# Patient Record
Sex: Female | Born: 1940 | Race: White | Hispanic: No | Marital: Single
Health system: Southern US, Community
[De-identification: ages and names within clinical notes are randomized; demographics above are authoritative.]

---

## 2009-02-28 ENCOUNTER — Ambulatory Visit: Payer: Self-pay | Admitting: Oncology

## 2009-03-14 ENCOUNTER — Ambulatory Visit: Payer: Self-pay | Admitting: Family Medicine

## 2009-03-27 ENCOUNTER — Ambulatory Visit: Payer: Self-pay | Admitting: Family Medicine

## 2009-03-27 ENCOUNTER — Ambulatory Visit: Payer: Self-pay | Admitting: Oncology

## 2009-03-31 ENCOUNTER — Ambulatory Visit: Payer: Self-pay | Admitting: Oncology

## 2009-04-01 ENCOUNTER — Ambulatory Visit: Payer: Self-pay | Admitting: Gastroenterology

## 2009-04-03 ENCOUNTER — Ambulatory Visit: Payer: Self-pay | Admitting: Oncology

## 2009-04-18 ENCOUNTER — Ambulatory Visit: Payer: Self-pay | Admitting: Surgery

## 2009-04-21 ENCOUNTER — Ambulatory Visit: Payer: Self-pay | Admitting: Oncology

## 2009-04-30 ENCOUNTER — Ambulatory Visit: Payer: Self-pay | Admitting: Oncology

## 2009-05-31 ENCOUNTER — Ambulatory Visit: Payer: Self-pay | Admitting: Oncology

## 2009-07-01 ENCOUNTER — Ambulatory Visit: Payer: Self-pay | Admitting: Oncology

## 2009-07-29 ENCOUNTER — Ambulatory Visit: Payer: Self-pay | Admitting: Oncology

## 2009-08-29 ENCOUNTER — Ambulatory Visit: Payer: Self-pay | Admitting: Oncology

## 2009-09-28 ENCOUNTER — Ambulatory Visit: Payer: Self-pay | Admitting: Oncology

## 2009-10-09 ENCOUNTER — Inpatient Hospital Stay: Payer: Self-pay | Admitting: Oncology

## 2009-10-29 ENCOUNTER — Ambulatory Visit: Payer: Self-pay | Admitting: Oncology

## 2009-11-14 LAB — CEA: CEA: 8.4 ng/mL — ABNORMAL HIGH (ref 0.0–4.7)

## 2009-11-28 ENCOUNTER — Ambulatory Visit: Payer: Self-pay | Admitting: Oncology

## 2009-12-29 ENCOUNTER — Ambulatory Visit: Payer: Self-pay | Admitting: Oncology

## 2010-01-29 ENCOUNTER — Ambulatory Visit: Payer: Self-pay | Admitting: Oncology

## 2010-02-28 ENCOUNTER — Ambulatory Visit: Payer: Self-pay | Admitting: Oncology

## 2010-03-31 ENCOUNTER — Ambulatory Visit: Payer: Self-pay | Admitting: Oncology

## 2010-04-01 ENCOUNTER — Ambulatory Visit: Payer: Self-pay | Admitting: Oncology

## 2010-04-30 ENCOUNTER — Ambulatory Visit: Payer: Self-pay | Admitting: Oncology

## 2010-05-31 ENCOUNTER — Ambulatory Visit: Payer: Self-pay | Admitting: Oncology

## 2010-06-08 ENCOUNTER — Inpatient Hospital Stay: Payer: Self-pay | Admitting: Oncology

## 2010-06-10 LAB — CEA: CEA: 3.9 ng/mL (ref 0.0–4.7)

## 2010-07-01 ENCOUNTER — Ambulatory Visit: Payer: Self-pay | Admitting: Oncology

## 2010-07-30 ENCOUNTER — Ambulatory Visit: Payer: Self-pay | Admitting: Oncology

## 2010-08-30 ENCOUNTER — Ambulatory Visit: Payer: Self-pay | Admitting: Oncology

## 2010-09-29 ENCOUNTER — Ambulatory Visit: Payer: Self-pay | Admitting: Oncology

## 2010-10-30 ENCOUNTER — Ambulatory Visit: Payer: Self-pay | Admitting: Oncology

## 2010-11-29 ENCOUNTER — Ambulatory Visit: Payer: Self-pay | Admitting: Oncology

## 2010-12-30 ENCOUNTER — Ambulatory Visit: Payer: Self-pay | Admitting: Oncology

## 2011-01-30 ENCOUNTER — Ambulatory Visit: Payer: Self-pay | Admitting: Oncology

## 2011-03-01 ENCOUNTER — Ambulatory Visit: Payer: Self-pay | Admitting: Oncology

## 2011-04-01 ENCOUNTER — Ambulatory Visit: Payer: Self-pay | Admitting: Oncology

## 2011-05-01 ENCOUNTER — Ambulatory Visit: Payer: Self-pay | Admitting: Oncology

## 2011-06-01 ENCOUNTER — Ambulatory Visit: Payer: Self-pay | Admitting: Oncology

## 2011-06-03 LAB — CBC CANCER CENTER
Basophil #: 0.1 x10 3/mm (ref 0.0–0.1)
Basophil %: 0.5 %
Eosinophil #: 0.3 x10 3/mm (ref 0.0–0.7)
Eosinophil %: 2.3 %
HGB: 10.9 g/dL — ABNORMAL LOW (ref 12.0–16.0)
Lymphocyte #: 1 x10 3/mm (ref 1.0–3.6)
MCH: 29.7 pg (ref 26.0–34.0)
MCHC: 33.3 g/dL (ref 32.0–36.0)
MCV: 89 fL (ref 80–100)
Monocyte %: 9.3 %
Neutrophil #: 10.5 x10 3/mm — ABNORMAL HIGH (ref 1.4–6.5)
Neutrophil %: 80.2 %
Platelet: 198 x10 3/mm (ref 150–440)
RBC: 3.66 10*6/uL — ABNORMAL LOW (ref 3.80–5.20)
WBC: 13 x10 3/mm — ABNORMAL HIGH (ref 3.6–11.0)

## 2011-06-03 LAB — COMPREHENSIVE METABOLIC PANEL
Albumin: 2.7 g/dL — ABNORMAL LOW (ref 3.4–5.0)
Alkaline Phosphatase: 289 U/L — ABNORMAL HIGH (ref 50–136)
Anion Gap: 8 (ref 7–16)
BUN: 13 mg/dL (ref 7–18)
Bilirubin,Total: 0.6 mg/dL (ref 0.2–1.0)
Calcium, Total: 8.6 mg/dL (ref 8.5–10.1)
Chloride: 107 mmol/L (ref 98–107)
Creatinine: 0.89 mg/dL (ref 0.60–1.30)
EGFR (Non-African Amer.): >60
Glucose: 129 mg/dL — ABNORMAL HIGH (ref 65–99)
Osmolality: 281 (ref 275–301)
SGOT(AST): 60 U/L — ABNORMAL HIGH (ref 15–37)
SGPT (ALT): 27 U/L
Total Protein: 7.1 g/dL (ref 6.4–8.2)

## 2011-06-11 IMAGING — CT CT HEAD WITHOUT AND WITH CONTRAST
1 of 2 series · 13 of 30 positions shown, 17 images · non-contrast
Comparison: none

REASON FOR EXAM: confusion
COMMENTS:

[Series 2: without · axial · non-contrast · 0.40mm/px · z∈[+908,+1038]mm · 13 of 32 slices shown, 17 images]
[im 3/32  brain]
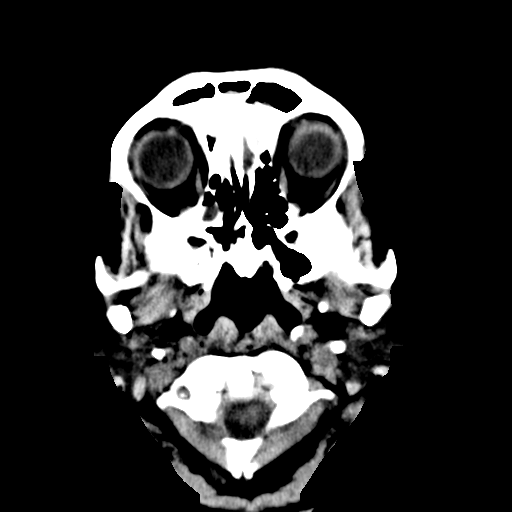
[im 3/32  bone]
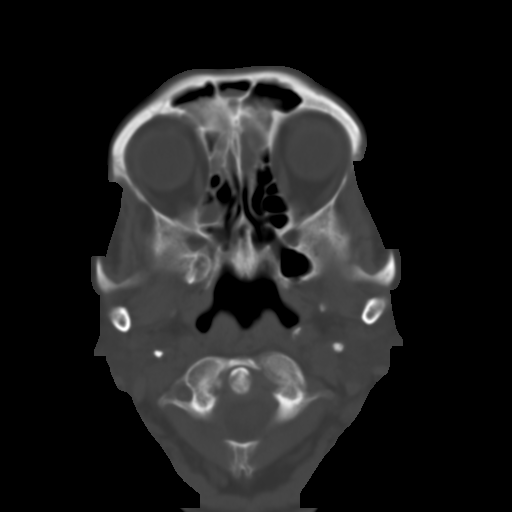
[im 5/32  brain]
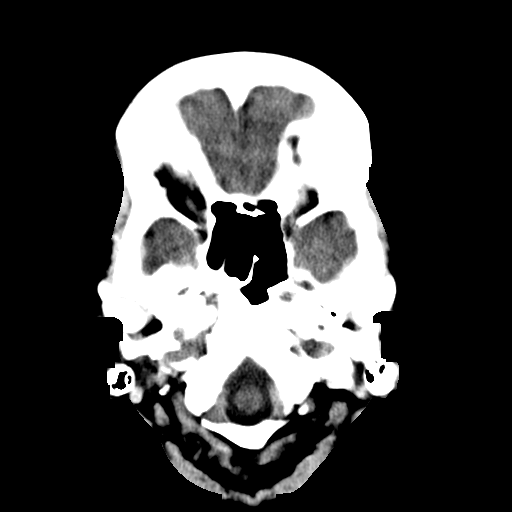
[im 7/32  brain]
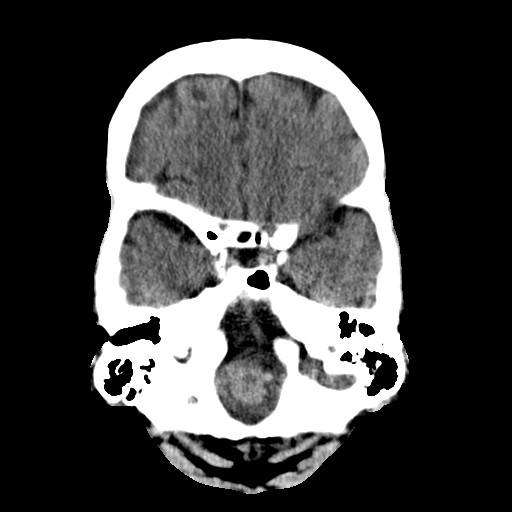
[im 9/32  brain]
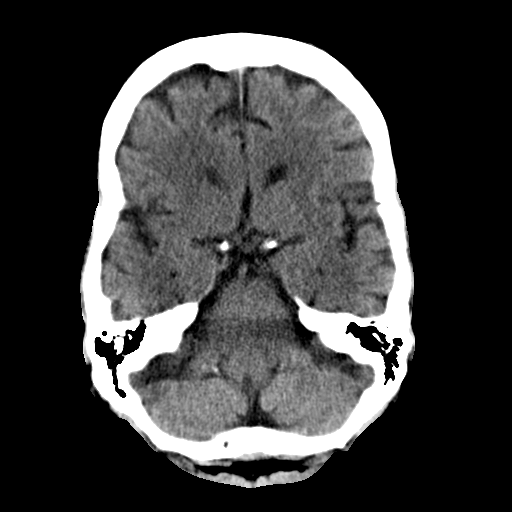
[im 12/32  brain]
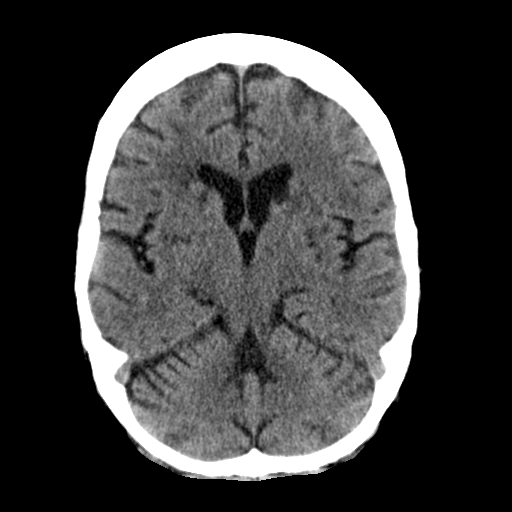
[im 12/32  bone]
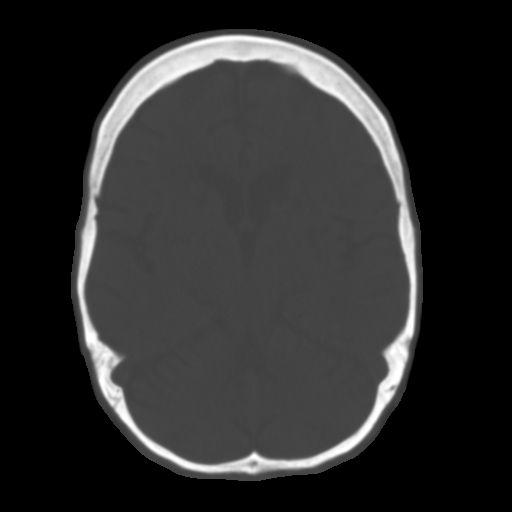
[im 14/32  brain]
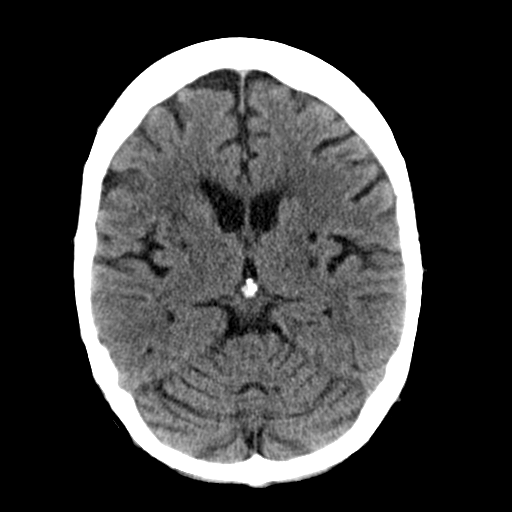
[im 16/32  brain]
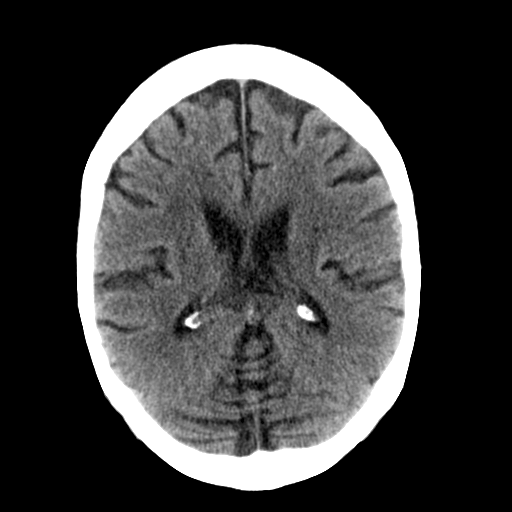
[im 18/32  brain]
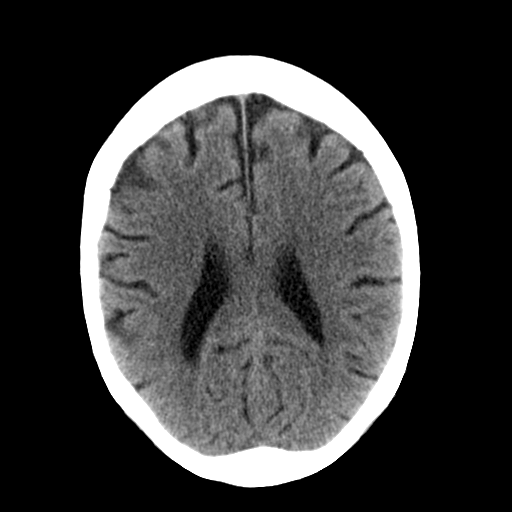
[im 20/32  brain]
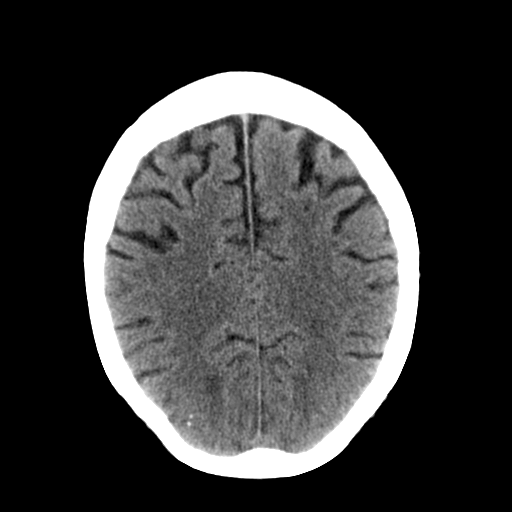
[im 20/32  bone]
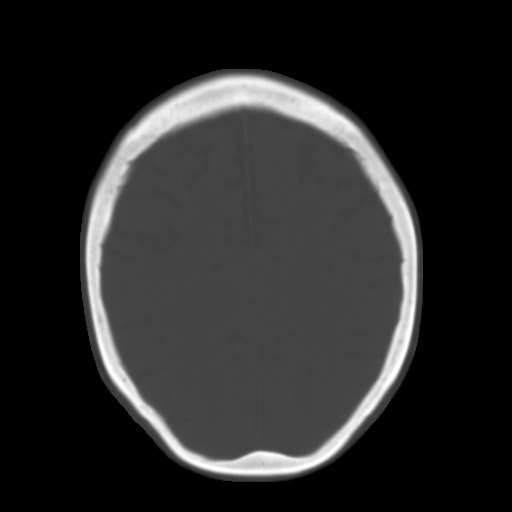
[im 23/32  brain]
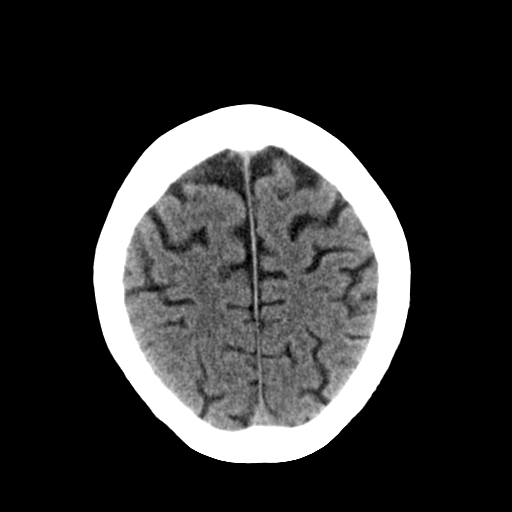
[im 25/32  brain]
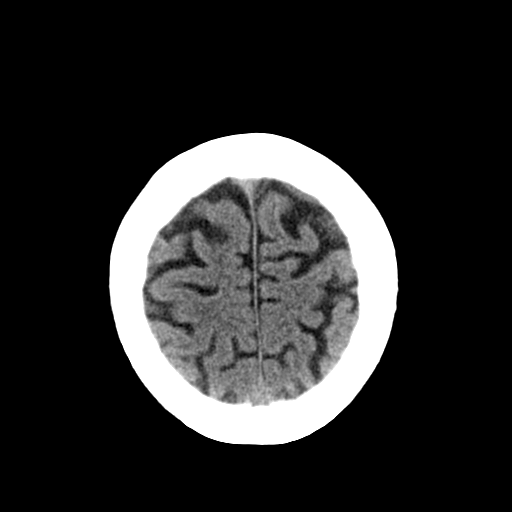
[im 27/32  brain]
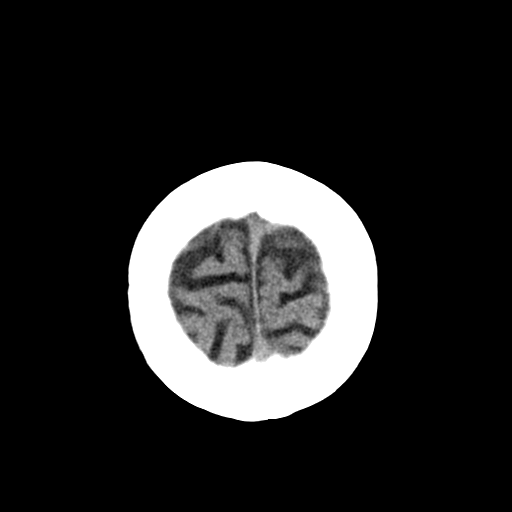
[im 29/32  brain]
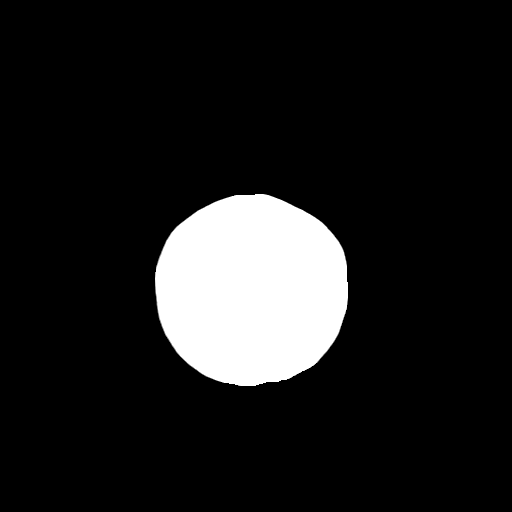
[im 29/32  bone]
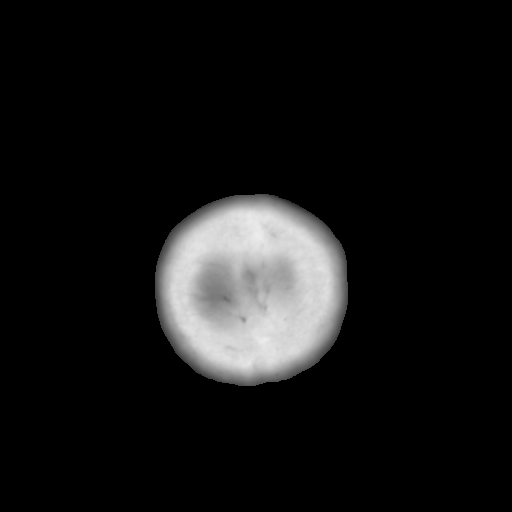

[13 of 30 positions shown; findings below may reference images not displayed]

PROCEDURE:     CT  - CT HEAD W/WO  - December 18, 2009  [DATE]

RESULT:     Axial noncontrast and contrast enhanced CT scans were performed
through the brain at 5 mm intervals and slice thicknesses. Comparison is
made to the study 09 October, 2009. The patient has history of colonic
malignancy and is experiencing mental status change.

The noncontrast images reveal the ventricles to be normal in size and
position. There is mild age-appropriate diffuse cerebral and cerebellar
atrophy. There are hypodensities in the basal ganglia bilaterally consistent
with prior lacunar infarctions. There is no evidence of an intracranial
hemorrhage or evolving ischemic infarction. Following contrast
administration the enhancement pattern of the brain parenchyma is within the
limits of normal. No enhancing masses are identified.

At bone window settings there is mucoperiosteal thickening and likely fluid
in the right maxillary sinus. There is a small amount of fluid in the right
ethmoid sinus. There is fluid and soft tissue density in the frontal
sinuses. A small amount of mucoperiosteal thickening in the sphenoid sinuses
is present. I do not see evidence of an acute skull fracture nor of a lytic
or blastic calvarial lesion.
IMPRESSION: 1. The findings are consistent with acute sinus inflammation involving the
right frontal, right ethmoid, right maxillary, and right sphenoid sinuses.
This may be contributing to the patient's mental status change.
2. I do not see acute intracranial abnormality. There are chronic changes
present.

The findings were relayed to Dr. [REDACTED] through the notify
procedure.(*)

## 2011-06-15 LAB — CBC CANCER CENTER
Basophil #: 0 x10 3/mm (ref 0.0–0.1)
Eosinophil #: 0.2 x10 3/mm (ref 0.0–0.7)
HCT: 20 % — ABNORMAL LOW (ref 35.0–47.0)
Lymphocyte #: 1.2 x10 3/mm (ref 1.0–3.6)
MCH: 30.2 pg (ref 26.0–34.0)
MCHC: 32.4 g/dL (ref 32.0–36.0)
MCV: 93 fL (ref 80–100)
Monocyte #: 1.2 x10 3/mm — ABNORMAL HIGH (ref 0.0–0.7)
Monocyte %: 6.8 %
Neutrophil #: 15.6 x10 3/mm — ABNORMAL HIGH (ref 1.4–6.5)
Platelet: 205 x10 3/mm (ref 150–440)
RBC: 2.15 10*6/uL — ABNORMAL LOW (ref 3.80–5.20)
RDW: 23.5 % — ABNORMAL HIGH (ref 11.5–14.5)
WBC: 18.2 x10 3/mm — ABNORMAL HIGH (ref 3.6–11.0)

## 2011-06-15 LAB — COMPREHENSIVE METABOLIC PANEL
Albumin: 2.6 g/dL — ABNORMAL LOW (ref 3.4–5.0)
Anion Gap: 12 (ref 7–16)
Calcium, Total: 8 mg/dL — ABNORMAL LOW (ref 8.5–10.1)
Co2: 21 mmol/L (ref 21–32)
EGFR (Non-African Amer.): >60
Glucose: 148 mg/dL — ABNORMAL HIGH (ref 65–99)
Osmolality: 288 (ref 275–301)
Potassium: 4.1 mmol/L (ref 3.5–5.1)
SGOT(AST): 35 U/L (ref 15–37)
SGPT (ALT): 21 U/L
Total Protein: 6.3 g/dL — ABNORMAL LOW (ref 6.4–8.2)

## 2011-06-15 LAB — MAGNESIUM: Magnesium: 1.6 mg/dL — ABNORMAL LOW

## 2011-06-22 LAB — CBC CANCER CENTER
Eosinophil #: 0 x10 3/mm (ref 0.0–0.7)
Eosinophil %: 0.2 %
Lymphocyte #: 0.8 x10 3/mm — ABNORMAL LOW (ref 1.0–3.6)
MCH: 30.1 pg (ref 26.0–34.0)
MCHC: 33.3 g/dL (ref 32.0–36.0)
MCV: 90 fL (ref 80–100)
Monocyte #: 0.7 x10 3/mm (ref 0.0–0.7)
Monocyte %: 3.7 %
Neutrophil %: 92.3 %
Platelet: 175 x10 3/mm (ref 150–440)
RBC: 3.26 10*6/uL — ABNORMAL LOW (ref 3.80–5.20)
RDW: 20.1 % — ABNORMAL HIGH (ref 11.5–14.5)
WBC: 19.9 x10 3/mm — ABNORMAL HIGH (ref 3.6–11.0)

## 2011-07-01 LAB — CBC CANCER CENTER
Basophil #: 0 x10 3/mm (ref 0.0–0.1)
Basophil %: 0 %
Eosinophil %: 1.3 %
Lymphocyte #: 0.9 x10 3/mm — ABNORMAL LOW (ref 1.0–3.6)
Lymphocyte %: 12.6 %
MCV: 91 fL (ref 80–100)
Monocyte %: 18.6 %
Neutrophil %: 67.5 %
Platelet: 212 x10 3/mm (ref 150–440)
RBC: 3.6 10*6/uL — ABNORMAL LOW (ref 3.80–5.20)
RDW: 22.5 % — ABNORMAL HIGH (ref 11.5–14.5)
WBC: 7.1 x10 3/mm (ref 3.6–11.0)

## 2011-07-01 LAB — COMPREHENSIVE METABOLIC PANEL
Albumin: 2.5 g/dL — ABNORMAL LOW (ref 3.4–5.0)
Alkaline Phosphatase: 209 U/L — ABNORMAL HIGH (ref 50–136)
Anion Gap: 9 (ref 7–16)
BUN: 19 mg/dL — ABNORMAL HIGH (ref 7–18)
Bilirubin,Total: 0.3 mg/dL (ref 0.2–1.0)
Calcium, Total: 8.2 mg/dL — ABNORMAL LOW (ref 8.5–10.1)
Chloride: 109 mmol/L — ABNORMAL HIGH (ref 98–107)
Co2: 24 mmol/L (ref 21–32)
Creatinine: 0.76 mg/dL (ref 0.60–1.30)
Osmolality: 287 (ref 275–301)
Potassium: 4.3 mmol/L (ref 3.5–5.1)
Sodium: 142 mmol/L (ref 136–145)
Total Protein: 6.4 g/dL (ref 6.4–8.2)

## 2011-07-02 ENCOUNTER — Ambulatory Visit: Payer: Self-pay | Admitting: Oncology

## 2011-07-20 LAB — CBC CANCER CENTER
Basophil %: 0.5 %
Comment - H1-Com3: NORMAL
Eosinophil %: 4 %
Eosinophil: 4 %
HCT: 24.9 % — ABNORMAL LOW (ref 35.0–47.0)
Lymphocyte #: 0.7 x10 3/mm — ABNORMAL LOW (ref 1.0–3.6)
MCH: 30.4 pg (ref 26.0–34.0)
MCHC: 33.3 g/dL (ref 32.0–36.0)
MCV: 91 fL (ref 80–100)
Monocyte #: 1.2 x10 3/mm — ABNORMAL HIGH (ref 0.0–0.7)
Monocyte %: 23.2 %
Neutrophil #: 2.9 x10 3/mm (ref 1.4–6.5)
Platelet: 129 x10 3/mm — ABNORMAL LOW (ref 150–440)
RDW: 22.8 % — ABNORMAL HIGH (ref 11.5–14.5)
Segmented Neutrophils: 60 %
WBC: 5 x10 3/mm (ref 3.6–11.0)

## 2011-07-20 LAB — COMPREHENSIVE METABOLIC PANEL
Anion Gap: 13 (ref 7–16)
BUN: 11 mg/dL (ref 7–18)
Bilirubin,Total: 0.5 mg/dL (ref 0.2–1.0)
Calcium, Total: 8 mg/dL — ABNORMAL LOW (ref 8.5–10.1)
Creatinine: 0.79 mg/dL (ref 0.60–1.30)
EGFR (African American): >60
EGFR (Non-African Amer.): >60
Glucose: 132 mg/dL — ABNORMAL HIGH (ref 65–99)
Osmolality: 279 (ref 275–301)

## 2011-07-20 LAB — MAGNESIUM: Magnesium: 1.6 mg/dL — ABNORMAL LOW

## 2011-07-30 ENCOUNTER — Ambulatory Visit: Payer: Self-pay | Admitting: Oncology

## 2011-08-03 LAB — CBC CANCER CENTER
Basophil #: 0 x10 3/mm (ref 0.0–0.1)
Eosinophil %: 3.7 %
Lymphocyte #: 0.8 x10 3/mm — ABNORMAL LOW (ref 1.0–3.6)
Lymphocyte %: 19.6 %
MCH: 30.2 pg (ref 26.0–34.0)
MCV: 90 fL (ref 80–100)
Monocyte #: 0.9 x10 3/mm — ABNORMAL HIGH (ref 0.0–0.7)
Neutrophil %: 52.5 %
Platelet: 73 x10 3/mm — ABNORMAL LOW (ref 150–440)
RBC: 3.2 10*6/uL — ABNORMAL LOW (ref 3.80–5.20)
RDW: 22 % — ABNORMAL HIGH (ref 11.5–14.5)

## 2011-08-03 LAB — COMPREHENSIVE METABOLIC PANEL
Anion Gap: 10 (ref 7–16)
Calcium, Total: 8 mg/dL — ABNORMAL LOW (ref 8.5–10.1)
Chloride: 108 mmol/L — ABNORMAL HIGH (ref 98–107)
Co2: 23 mmol/L (ref 21–32)
Creatinine: 0.69 mg/dL (ref 0.60–1.30)
EGFR (African American): >60
EGFR (Non-African Amer.): >60
Glucose: 113 mg/dL — ABNORMAL HIGH (ref 65–99)
Potassium: 4.1 mmol/L (ref 3.5–5.1)
SGOT(AST): 50 U/L — ABNORMAL HIGH (ref 15–37)
SGPT (ALT): 28 U/L

## 2011-08-03 LAB — MAGNESIUM: Magnesium: 1.5 mg/dL — ABNORMAL LOW

## 2011-08-17 LAB — CBC CANCER CENTER
Basophil %: 0.9 %
Eosinophil #: 0.3 x10 3/mm (ref 0.0–0.7)
HCT: 16.1 % — ABNORMAL LOW (ref 35.0–47.0)
HGB: 5.2 g/dL — ABNORMAL LOW (ref 12.0–16.0)
MCH: 29.8 pg (ref 26.0–34.0)
MCHC: 32 g/dL (ref 32.0–36.0)
MCV: 93 fL (ref 80–100)
Monocyte #: 1.9 x10 3/mm — ABNORMAL HIGH (ref 0.0–0.7)
Neutrophil #: 8.3 x10 3/mm — ABNORMAL HIGH (ref 1.4–6.5)
Platelet: 260 x10 3/mm (ref 150–440)
RBC: 1.73 10*6/uL — ABNORMAL LOW (ref 3.80–5.20)

## 2011-08-17 LAB — COMPREHENSIVE METABOLIC PANEL
Anion Gap: 16 (ref 7–16)
BUN: 37 mg/dL — ABNORMAL HIGH (ref 7–18)
Calcium, Total: 8 mg/dL — ABNORMAL LOW (ref 8.5–10.1)
Chloride: 110 mmol/L — ABNORMAL HIGH (ref 98–107)
Co2: 17 mmol/L — ABNORMAL LOW (ref 21–32)
EGFR (African American): >60
EGFR (Non-African Amer.): 57 — ABNORMAL LOW
Osmolality: 296 (ref 275–301)
Potassium: 3.8 mmol/L (ref 3.5–5.1)
SGOT(AST): 72 U/L — ABNORMAL HIGH (ref 15–37)
SGPT (ALT): 34 U/L
Total Protein: 6 g/dL — ABNORMAL LOW (ref 6.4–8.2)

## 2011-08-20 LAB — OCCULT BLOOD X 1 CARD TO LAB, STOOL
Occult Blood, Feces: POSITIVE
Occult Blood, Feces: NEGATIVE

## 2011-08-24 LAB — CBC CANCER CENTER
Basophil #: 0.1 x10 3/mm (ref 0.0–0.1)
Basophil %: 1.1 %
Lymphocyte %: 9.3 %
MCH: 29.4 pg (ref 26.0–34.0)
Monocyte #: 1.6 x10 3/mm — ABNORMAL HIGH (ref 0.0–0.7)
Monocyte %: 17.4 %
RDW: 20.3 % — ABNORMAL HIGH (ref 11.5–14.5)
WBC: 9.1 x10 3/mm (ref 3.6–11.0)

## 2011-08-24 LAB — COMPREHENSIVE METABOLIC PANEL
Alkaline Phosphatase: 227 U/L — ABNORMAL HIGH (ref 50–136)
BUN: 14 mg/dL (ref 7–18)
Calcium, Total: 8 mg/dL — ABNORMAL LOW (ref 8.5–10.1)
Co2: 19 mmol/L — ABNORMAL LOW (ref 21–32)
Creatinine: 0.81 mg/dL (ref 0.60–1.30)
EGFR (African American): >60
Potassium: 4.4 mmol/L (ref 3.5–5.1)
SGPT (ALT): 44 U/L
Sodium: 139 mmol/L (ref 136–145)
Total Protein: 6.2 g/dL — ABNORMAL LOW (ref 6.4–8.2)

## 2011-08-24 LAB — MAGNESIUM: Magnesium: 1.7 mg/dL — ABNORMAL LOW

## 2011-08-30 ENCOUNTER — Ambulatory Visit: Payer: Self-pay | Admitting: Oncology

## 2011-09-07 LAB — CBC CANCER CENTER
Basophil #: 0 x10 3/mm (ref 0.0–0.1)
Basophil %: 0 %
Eosinophil %: 3.9 %
HGB: 9.5 g/dL — ABNORMAL LOW (ref 12.0–16.0)
Lymphocyte #: 0.6 x10 3/mm — ABNORMAL LOW (ref 1.0–3.6)
Lymphocyte %: 10.2 %
MCHC: 32.6 g/dL (ref 32.0–36.0)
Monocyte #: 1 x10 3/mm — ABNORMAL HIGH (ref 0.0–0.7)
Neutrophil #: 4.1 x10 3/mm (ref 1.4–6.5)
Platelet: 177 x10 3/mm (ref 150–440)
RBC: 3.29 10*6/uL — ABNORMAL LOW (ref 3.80–5.20)
WBC: 6 x10 3/mm (ref 3.6–11.0)

## 2011-09-07 LAB — BASIC METABOLIC PANEL
BUN: 9 mg/dL (ref 7–18)
Calcium, Total: 8.7 mg/dL (ref 8.5–10.1)
Co2: 22 mmol/L (ref 21–32)
Creatinine: 0.76 mg/dL (ref 0.60–1.30)
EGFR (African American): >60
Osmolality: 282 (ref 275–301)

## 2011-09-07 LAB — MAGNESIUM: Magnesium: 1.6 mg/dL — ABNORMAL LOW

## 2011-09-28 LAB — COMPREHENSIVE METABOLIC PANEL
Albumin: 2.5 g/dL — ABNORMAL LOW (ref 3.4–5.0)
Alkaline Phosphatase: 242 U/L — ABNORMAL HIGH (ref 50–136)
Anion Gap: 13 (ref 7–16)
Bilirubin,Total: 0.5 mg/dL (ref 0.2–1.0)
Co2: 16 mmol/L — ABNORMAL LOW (ref 21–32)
Creatinine: 0.72 mg/dL (ref 0.60–1.30)
EGFR (African American): >60
Glucose: 127 mg/dL — ABNORMAL HIGH (ref 65–99)
Osmolality: 286 (ref 275–301)
Potassium: 4.1 mmol/L (ref 3.5–5.1)
SGOT(AST): 96 U/L — ABNORMAL HIGH (ref 15–37)

## 2011-09-28 LAB — CBC CANCER CENTER
Basophil #: 0.2 x10 3/mm — ABNORMAL HIGH (ref 0.0–0.1)
Basophil %: 2.3 %
Eosinophil #: 0.2 x10 3/mm (ref 0.0–0.7)
Eosinophil %: 3 %
Lymphocyte #: 0.8 x10 3/mm — ABNORMAL LOW (ref 1.0–3.6)
Lymphocyte %: 11.7 %
MCHC: 31.5 g/dL — ABNORMAL LOW (ref 32.0–36.0)
Monocyte #: 2 x10 3/mm — ABNORMAL HIGH (ref 0.2–0.9)
Monocyte %: 30 %
RBC: 3.14 10*6/uL — ABNORMAL LOW (ref 3.80–5.20)
RDW: 21 % — ABNORMAL HIGH (ref 11.5–14.5)

## 2011-09-28 LAB — MAGNESIUM: Magnesium: 1.5 mg/dL — ABNORMAL LOW

## 2011-09-29 ENCOUNTER — Ambulatory Visit: Payer: Self-pay | Admitting: Oncology

## 2011-10-12 LAB — CBC CANCER CENTER
Basophil %: 1.5 %
Eosinophil #: 0.3 x10 3/mm (ref 0.0–0.7)
Eosinophil %: 5.3 %
HGB: 8.7 g/dL — ABNORMAL LOW (ref 12.0–16.0)
Lymphocyte #: 0.7 x10 3/mm — ABNORMAL LOW (ref 1.0–3.6)
MCHC: 31.3 g/dL — ABNORMAL LOW (ref 32.0–36.0)
Neutrophil #: 2.5 x10 3/mm (ref 1.4–6.5)
Neutrophil %: 51.5 %
Platelet: 158 x10 3/mm (ref 150–440)
RBC: 3.24 10*6/uL — ABNORMAL LOW (ref 3.80–5.20)
RDW: 21.9 % — ABNORMAL HIGH (ref 11.5–14.5)

## 2011-10-12 LAB — COMPREHENSIVE METABOLIC PANEL
Albumin: 2.6 g/dL — ABNORMAL LOW (ref 3.4–5.0)
Co2: 21 mmol/L (ref 21–32)
Creatinine: 0.69 mg/dL (ref 0.60–1.30)
EGFR (African American): >60
Glucose: 140 mg/dL — ABNORMAL HIGH (ref 65–99)
Osmolality: 281 (ref 275–301)
SGOT(AST): 78 U/L — ABNORMAL HIGH (ref 15–37)
SGPT (ALT): 25 U/L
Sodium: 140 mmol/L (ref 136–145)
Total Protein: 6.4 g/dL (ref 6.4–8.2)

## 2011-10-13 LAB — CEA: CEA: 723.2 ng/mL — ABNORMAL HIGH (ref 0.0–4.7)

## 2011-10-19 LAB — CBC CANCER CENTER
Basophil #: 0 x10 3/mm (ref 0.0–0.1)
Basophil %: 2.3 %
Eosinophil #: 0.1 x10 3/mm (ref 0.0–0.7)
Eosinophil %: 7.6 %
Lymphocyte %: 27.4 %
MCHC: 31.3 g/dL — ABNORMAL LOW (ref 32.0–36.0)
MCV: 85 fL (ref 80–100)
Monocyte #: 0.5 x10 3/mm (ref 0.2–0.9)
Monocyte %: 25.8 %
Neutrophil %: 36.9 %
Platelet: 161 x10 3/mm (ref 150–440)
WBC: 1.9 x10 3/mm — CL (ref 3.6–11.0)

## 2011-10-19 LAB — COMPREHENSIVE METABOLIC PANEL
BUN: 11 mg/dL (ref 7–18)
Calcium, Total: 8.8 mg/dL (ref 8.5–10.1)
Co2: 22 mmol/L (ref 21–32)
EGFR (African American): >60
EGFR (Non-African Amer.): >60
Osmolality: 280 (ref 275–301)
Potassium: 4.3 mmol/L (ref 3.5–5.1)
SGOT(AST): 73 U/L — ABNORMAL HIGH (ref 15–37)

## 2011-10-26 LAB — CBC CANCER CENTER
Eosinophil #: 0.1 x10 3/mm (ref 0.0–0.7)
HCT: 29.6 % — ABNORMAL LOW (ref 35.0–47.0)
HGB: 9.3 g/dL — ABNORMAL LOW (ref 12.0–16.0)
Lymphocyte #: 0.6 x10 3/mm — ABNORMAL LOW (ref 1.0–3.6)
Lymphocyte %: 12.6 %
MCV: 85 fL (ref 80–100)
Monocyte %: 24.2 %
Neutrophil #: 3 x10 3/mm (ref 1.4–6.5)
Neutrophil %: 59.1 %
Platelet: 126 x10 3/mm — ABNORMAL LOW (ref 150–440)
RBC: 3.47 10*6/uL — ABNORMAL LOW (ref 3.80–5.20)
WBC: 5 x10 3/mm (ref 3.6–11.0)

## 2011-10-26 LAB — COMPREHENSIVE METABOLIC PANEL
Albumin: 2.7 g/dL — ABNORMAL LOW (ref 3.4–5.0)
Anion Gap: 12 (ref 7–16)
BUN: 8 mg/dL (ref 7–18)
Bilirubin,Total: 0.5 mg/dL (ref 0.2–1.0)
Calcium, Total: 8.2 mg/dL — ABNORMAL LOW (ref 8.5–10.1)
Co2: 19 mmol/L — ABNORMAL LOW (ref 21–32)
Creatinine: 0.55 mg/dL — ABNORMAL LOW (ref 0.60–1.30)
EGFR (African American): >60
Glucose: 101 mg/dL — ABNORMAL HIGH (ref 65–99)
Osmolality: 282 (ref 275–301)
SGPT (ALT): 30 U/L
Total Protein: 6.1 g/dL — ABNORMAL LOW (ref 6.4–8.2)

## 2011-10-30 ENCOUNTER — Ambulatory Visit: Payer: Self-pay | Admitting: Oncology

## 2011-11-02 ENCOUNTER — Inpatient Hospital Stay: Payer: Self-pay | Admitting: Oncology

## 2011-11-02 LAB — COMPREHENSIVE METABOLIC PANEL
Anion Gap: 13 (ref 7–16)
Bilirubin,Total: 1.2 mg/dL — ABNORMAL HIGH (ref 0.2–1.0)
Creatinine: 0.83 mg/dL (ref 0.60–1.30)
EGFR (Non-African Amer.): >60
Osmolality: 277 (ref 275–301)
Potassium: 4.7 mmol/L (ref 3.5–5.1)
SGOT(AST): 35 U/L (ref 15–37)
Sodium: 139 mmol/L (ref 136–145)
Total Protein: 6.5 g/dL (ref 6.4–8.2)

## 2011-11-02 LAB — CBC CANCER CENTER
Eosinophil #: 0 x10 3/mm (ref 0.0–0.7)
Eosinophil %: 1 %
HGB: 9.9 g/dL — ABNORMAL LOW (ref 12.0–16.0)
Lymphocyte #: 0.3 x10 3/mm — ABNORMAL LOW (ref 1.0–3.6)
Lymphocyte %: 24.3 %
MCH: 26.6 pg (ref 26.0–34.0)
MCHC: 31.5 g/dL — ABNORMAL LOW (ref 32.0–36.0)
MCV: 84 fL (ref 80–100)
Monocyte #: 0.2 x10 3/mm (ref 0.2–0.9)
Monocyte %: 18.3 %
Neutrophil #: 0.7 x10 3/mm — ABNORMAL LOW (ref 1.4–6.5)
Neutrophil %: 55.2 %
Platelet: 118 x10 3/mm — ABNORMAL LOW (ref 150–440)
RBC: 3.71 10*6/uL — ABNORMAL LOW (ref 3.80–5.20)

## 2011-11-02 LAB — URINALYSIS, COMPLETE
Protein: NEGATIVE
RBC,UR: 3 /HPF (ref 0–5)
WBC UR: 5 /HPF (ref 0–5)

## 2011-11-02 LAB — MAGNESIUM: Magnesium: 1.2 mg/dL — ABNORMAL LOW

## 2011-11-03 LAB — COMPREHENSIVE METABOLIC PANEL
Albumin: 2.3 g/dL — ABNORMAL LOW (ref 3.4–5.0)
Anion Gap: 10 (ref 7–16)
Bilirubin,Total: 0.7 mg/dL (ref 0.2–1.0)
Calcium, Total: 7.6 mg/dL — ABNORMAL LOW (ref 8.5–10.1)
Chloride: 109 mmol/L — ABNORMAL HIGH (ref 98–107)
Creatinine: 0.73 mg/dL (ref 0.60–1.30)
EGFR (African American): >60
EGFR (Non-African Amer.): >60
Glucose: 96 mg/dL (ref 65–99)
Potassium: 3.6 mmol/L (ref 3.5–5.1)
SGPT (ALT): 17 U/L

## 2011-11-03 LAB — CBC WITH DIFFERENTIAL/PLATELET
Basophil #: 0 10*3/uL (ref 0.0–0.1)
Eosinophil #: 0.1 10*3/uL (ref 0.0–0.7)
HCT: 23.3 % — ABNORMAL LOW (ref 35.0–47.0)
Lymphocyte %: 26.7 %
MCH: 26.9 pg (ref 26.0–34.0)
MCHC: 31.9 g/dL — ABNORMAL LOW (ref 32.0–36.0)
Neutrophil %: 46.6 %
RBC: 2.76 10*6/uL — ABNORMAL LOW (ref 3.80–5.20)

## 2011-11-03 LAB — VANCOMYCIN, TROUGH: Vancomycin, Trough: 7 ug/mL — ABNORMAL LOW (ref 10–20)

## 2011-11-04 LAB — URINE CULTURE

## 2011-11-04 LAB — CBC WITH DIFFERENTIAL/PLATELET
Basophil %: 1.5 %
Eosinophil %: 7.5 %
Lymphocyte #: 0.4 10*3/uL — ABNORMAL LOW (ref 1.0–3.6)
Lymphocyte %: 19.1 %
Monocyte %: 19.3 %
Neutrophil #: 1.1 10*3/uL — ABNORMAL LOW (ref 1.4–6.5)
RBC: 3.17 10*6/uL — ABNORMAL LOW (ref 3.80–5.20)
RDW: 21.7 % — ABNORMAL HIGH (ref 11.5–14.5)
WBC: 2 10*3/uL — CL (ref 3.6–11.0)

## 2011-11-04 LAB — COMPREHENSIVE METABOLIC PANEL
Albumin: 2.2 g/dL — ABNORMAL LOW (ref 3.4–5.0)
Calcium, Total: 7.5 mg/dL — ABNORMAL LOW (ref 8.5–10.1)
Chloride: 112 mmol/L — ABNORMAL HIGH (ref 98–107)
EGFR (African American): >60
Osmolality: 279 (ref 275–301)
Potassium: 3.2 mmol/L — ABNORMAL LOW (ref 3.5–5.1)
SGOT(AST): 23 U/L (ref 15–37)
SGPT (ALT): 16 U/L

## 2011-11-04 LAB — MAGNESIUM: Magnesium: 1.6 mg/dL — ABNORMAL LOW

## 2011-11-05 LAB — CBC WITH DIFFERENTIAL/PLATELET
Basophil #: 0 10*3/uL (ref 0.0–0.1)
Basophil %: 0.8 %
Eosinophil #: 0.1 10*3/uL (ref 0.0–0.7)
Eosinophil %: 6.8 %
HCT: 27.3 % — ABNORMAL LOW (ref 35.0–47.0)
HGB: 8.8 g/dL — ABNORMAL LOW (ref 12.0–16.0)
Lymphocyte %: 17.4 %
MCH: 28.3 pg (ref 26.0–34.0)
MCHC: 32.1 g/dL (ref 32.0–36.0)
MCV: 88 fL (ref 80–100)
Monocyte %: 21.5 %
RBC: 3.1 10*6/uL — ABNORMAL LOW (ref 3.80–5.20)
WBC: 2.2 10*3/uL — ABNORMAL LOW (ref 3.6–11.0)

## 2011-11-05 LAB — BASIC METABOLIC PANEL
Anion Gap: 12 (ref 7–16)
Calcium, Total: 7.5 mg/dL — ABNORMAL LOW (ref 8.5–10.1)
Chloride: 114 mmol/L — ABNORMAL HIGH (ref 98–107)
Co2: 16 mmol/L — ABNORMAL LOW (ref 21–32)
Creatinine: 0.76 mg/dL (ref 0.60–1.30)
EGFR (Non-African Amer.): >60
Osmolality: 281 (ref 275–301)

## 2011-11-05 LAB — MAGNESIUM: Magnesium: 1.6 mg/dL — ABNORMAL LOW

## 2011-11-06 LAB — BASIC METABOLIC PANEL
Anion Gap: 10 (ref 7–16)
BUN: 6 mg/dL — ABNORMAL LOW (ref 7–18)
Calcium, Total: 7.5 mg/dL — ABNORMAL LOW (ref 8.5–10.1)
Creatinine: 0.64 mg/dL (ref 0.60–1.30)
EGFR (African American): >60

## 2011-11-06 LAB — CBC WITH DIFFERENTIAL/PLATELET
Basophil #: 0 10*3/uL (ref 0.0–0.1)
Basophil %: 1.2 %
Eosinophil #: 0.2 10*3/uL (ref 0.0–0.7)
Eosinophil %: 10.8 %
HCT: 26.7 % — ABNORMAL LOW (ref 35.0–47.0)
HGB: 8.6 g/dL — ABNORMAL LOW (ref 12.0–16.0)
Lymphocyte #: 0.4 10*3/uL — ABNORMAL LOW (ref 1.0–3.6)
MCHC: 32.2 g/dL (ref 32.0–36.0)
Monocyte #: 0.6 x10 3/mm (ref 0.2–0.9)
Neutrophil #: 0.9 10*3/uL — ABNORMAL LOW (ref 1.4–6.5)
Neutrophil %: 42.7 %
Platelet: 121 10*3/uL — ABNORMAL LOW (ref 150–440)
RBC: 3.06 10*6/uL — ABNORMAL LOW (ref 3.80–5.20)

## 2011-11-06 LAB — MAGNESIUM: Magnesium: 1.6 mg/dL — ABNORMAL LOW

## 2011-11-07 LAB — CBC WITH DIFFERENTIAL/PLATELET
Basophil #: 0 10*3/uL (ref 0.0–0.1)
Basophil %: 1.4 %
Eosinophil #: 0.3 10*3/uL (ref 0.0–0.7)
Eosinophil %: 13.2 %
HCT: 26.8 % — ABNORMAL LOW (ref 35.0–47.0)
HGB: 8.7 g/dL — ABNORMAL LOW (ref 12.0–16.0)
Lymphocyte %: 23.6 %
MCV: 87 fL (ref 80–100)
Monocyte #: 0.6 x10 3/mm (ref 0.2–0.9)
Neutrophil #: 0.6 10*3/uL — ABNORMAL LOW (ref 1.4–6.5)
RBC: 3.07 10*6/uL — ABNORMAL LOW (ref 3.80–5.20)
RDW: 22.7 % — ABNORMAL HIGH (ref 11.5–14.5)
WBC: 1.9 10*3/uL — CL (ref 3.6–11.0)

## 2011-11-08 LAB — CBC WITH DIFFERENTIAL/PLATELET
Basophil #: 0 10*3/uL (ref 0.0–0.1)
Basophil %: 1.5 %
Eosinophil #: 0.2 10*3/uL (ref 0.0–0.7)
Eosinophil %: 9.7 %
Lymphocyte #: 0.5 10*3/uL — ABNORMAL LOW (ref 1.0–3.6)
MCH: 28.3 pg (ref 26.0–34.0)
MCV: 92 fL (ref 80–100)
Monocyte #: 0.7 x10 3/mm (ref 0.2–0.9)
Monocyte %: 34.5 %
Neutrophil #: 0.6 10*3/uL — ABNORMAL LOW (ref 1.4–6.5)
Platelet: 115 10*3/uL — ABNORMAL LOW (ref 150–440)
RDW: 24.6 % — ABNORMAL HIGH (ref 11.5–14.5)
WBC: 2.1 10*3/uL — ABNORMAL LOW (ref 3.6–11.0)

## 2011-11-08 LAB — BASIC METABOLIC PANEL
Anion Gap: 13 (ref 7–16)
BUN: 4 mg/dL — ABNORMAL LOW (ref 7–18)
Calcium, Total: 7.4 mg/dL — ABNORMAL LOW (ref 8.5–10.1)
Chloride: 121 mmol/L — ABNORMAL HIGH (ref 98–107)
Co2: 13 mmol/L — ABNORMAL LOW (ref 21–32)
Creatinine: 0.56 mg/dL — ABNORMAL LOW (ref 0.60–1.30)
Glucose: 85 mg/dL (ref 65–99)
Potassium: 3.3 mmol/L — ABNORMAL LOW (ref 3.5–5.1)
Sodium: 147 mmol/L — ABNORMAL HIGH (ref 136–145)

## 2011-11-08 LAB — URINALYSIS, COMPLETE
Blood: NEGATIVE
Hyaline Cast: 7
RBC,UR: 7 /HPF (ref 0–5)
Squamous Epithelial: 2
WBC UR: 5 /HPF (ref 0–5)

## 2011-11-08 LAB — MAGNESIUM: Magnesium: 1.4 mg/dL — ABNORMAL LOW

## 2011-11-08 LAB — CULTURE, BLOOD (SINGLE)

## 2011-11-09 LAB — CBC WITH DIFFERENTIAL/PLATELET
Basophil #: 0.1 10*3/uL (ref 0.0–0.1)
Basophil %: 0.7 %
HGB: 8.9 g/dL — ABNORMAL LOW (ref 12.0–16.0)
Lymphocyte %: 7.2 %
MCH: 28.6 pg (ref 26.0–34.0)
MCHC: 32.5 g/dL (ref 32.0–36.0)
MCV: 88 fL (ref 80–100)
Monocyte #: 1.1 x10 3/mm — ABNORMAL HIGH (ref 0.2–0.9)
Monocyte %: 12.6 %
Neutrophil #: 6.8 10*3/uL — ABNORMAL HIGH (ref 1.4–6.5)
RBC: 3.13 10*6/uL — ABNORMAL LOW (ref 3.80–5.20)
RDW: 23.1 % — ABNORMAL HIGH (ref 11.5–14.5)

## 2011-11-09 LAB — BASIC METABOLIC PANEL
BUN: 3 mg/dL — ABNORMAL LOW (ref 7–18)
Calcium, Total: 7.5 mg/dL — ABNORMAL LOW (ref 8.5–10.1)
EGFR (African American): >60
Glucose: 70 mg/dL (ref 65–99)
Sodium: 145 mmol/L (ref 136–145)

## 2011-11-09 LAB — MAGNESIUM: Magnesium: 1.7 mg/dL — ABNORMAL LOW

## 2011-11-10 ENCOUNTER — Ambulatory Visit: Payer: Self-pay | Admitting: Oncology

## 2011-11-10 LAB — URINE CULTURE

## 2011-11-23 LAB — COMPREHENSIVE METABOLIC PANEL
Albumin: 2.4 g/dL — ABNORMAL LOW (ref 3.4–5.0)
Anion Gap: 8 (ref 7–16)
BUN: 14 mg/dL (ref 7–18)
Creatinine: 0.76 mg/dL (ref 0.60–1.30)
EGFR (African American): >60
EGFR (Non-African Amer.): >60
Osmolality: 281 (ref 275–301)
Potassium: 5.6 mmol/L — ABNORMAL HIGH (ref 3.5–5.1)
SGOT(AST): 61 U/L — ABNORMAL HIGH (ref 15–37)
SGPT (ALT): 25 U/L
Total Protein: 5.5 g/dL — ABNORMAL LOW (ref 6.4–8.2)

## 2011-11-23 LAB — CBC CANCER CENTER
Basophil #: 0.1 x10 3/mm (ref 0.0–0.1)
Eosinophil %: 2 %
HCT: 28.5 % — ABNORMAL LOW (ref 35.0–47.0)
HGB: 9 g/dL — ABNORMAL LOW (ref 12.0–16.0)
Lymphocyte %: 13.2 %
MCH: 27.7 pg (ref 26.0–34.0)
MCHC: 31.5 g/dL — ABNORMAL LOW (ref 32.0–36.0)
Monocyte %: 14.6 %
Neutrophil #: 8.6 x10 3/mm — ABNORMAL HIGH (ref 1.4–6.5)
Neutrophil %: 69 %
Platelet: 149 x10 3/mm — ABNORMAL LOW (ref 150–440)
RDW: 24.9 % — ABNORMAL HIGH (ref 11.5–14.5)
WBC: 12.5 x10 3/mm — ABNORMAL HIGH (ref 3.6–11.0)

## 2011-11-29 ENCOUNTER — Ambulatory Visit: Payer: Self-pay | Admitting: Oncology

## 2012-01-30 DEATH — deceased

## 2013-04-25 IMAGING — CT CT HEAD WITHOUT AND WITH CONTRAST
1 of 2 series · 13 of 30 positions shown, 17 images · non-contrast
Comparison: none

REASON FOR EXAM: Stage IV colon Cancer, confusion
COMMENTS:

PROCEDURE:     CT  - CT HEAD W/WO  - November 02, 2011  [DATE]
RESULT:
TECHNIQUE: CT of the brain is performed without contrast and following
intravenous administration of 50 ml of Nsovue-DDD iodinated intravenous
contrast with images compared to the most recent study 18 December, 2009 and
to a study 09 October, 2009.

[Series 2: without · axial · non-contrast · 0.40mm/px · z∈[-150,-30]mm · 13 of 30 slices shown, 17 images]
[im 3/30  brain]
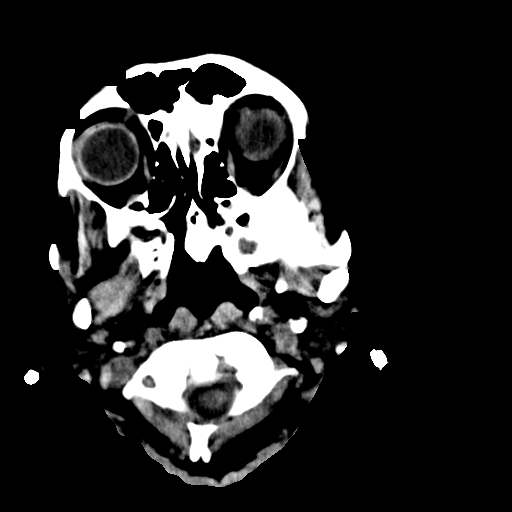
[im 3/30  bone]
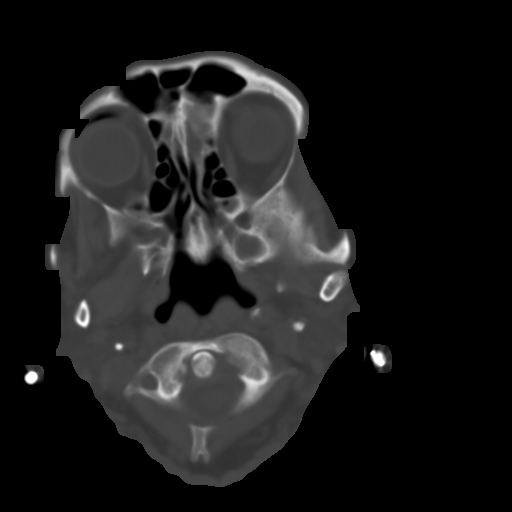
[im 5/30  brain]
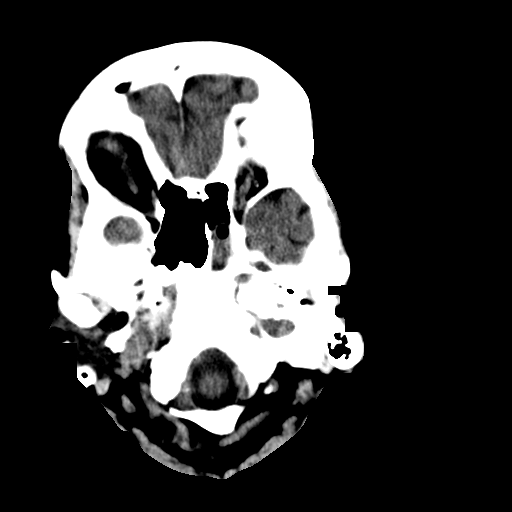
[im 7/30  brain]
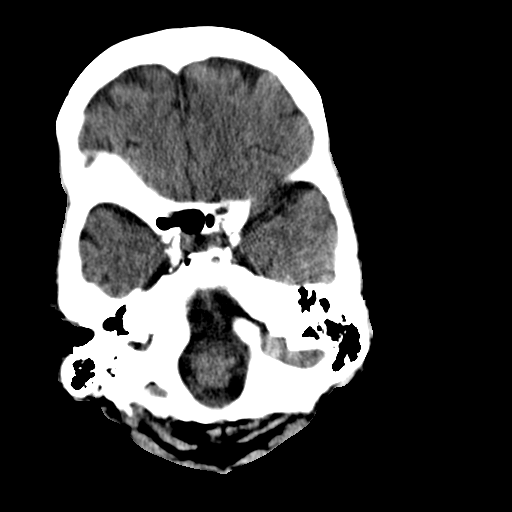
[im 9/30  brain]
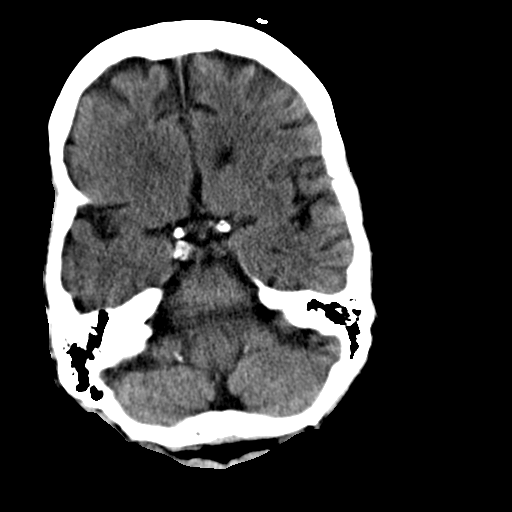
[im 11/30  brain]
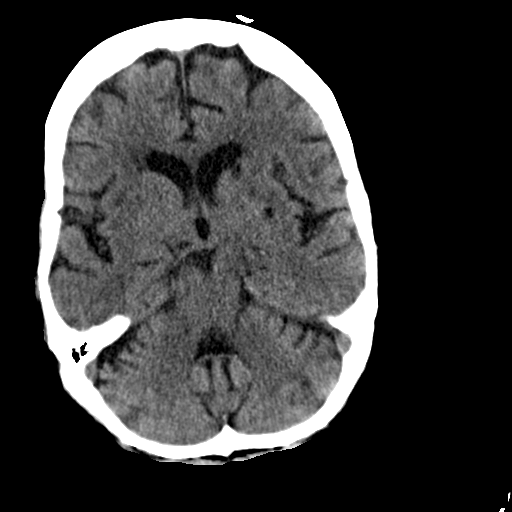
[im 11/30  bone]
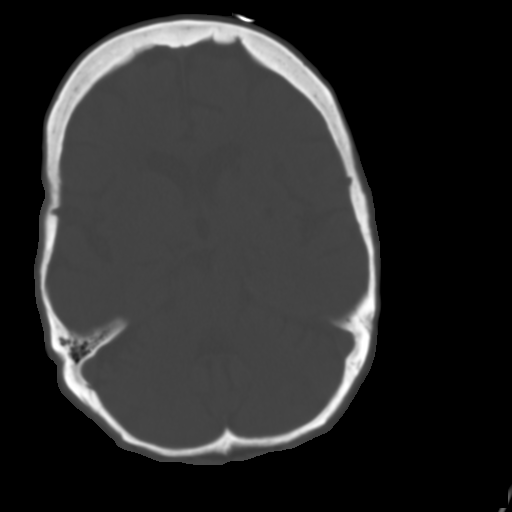
[im 13/30  brain]
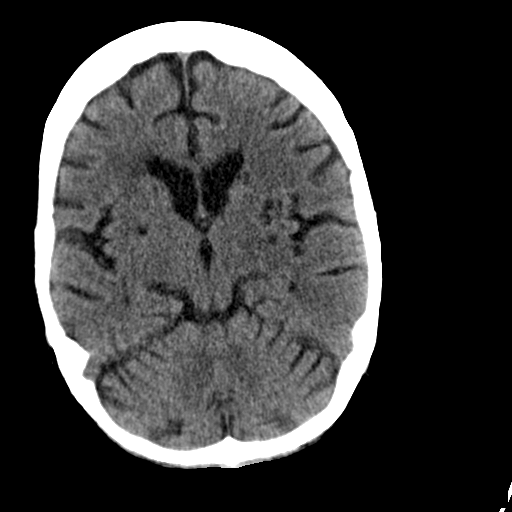
[im 15/30  brain]
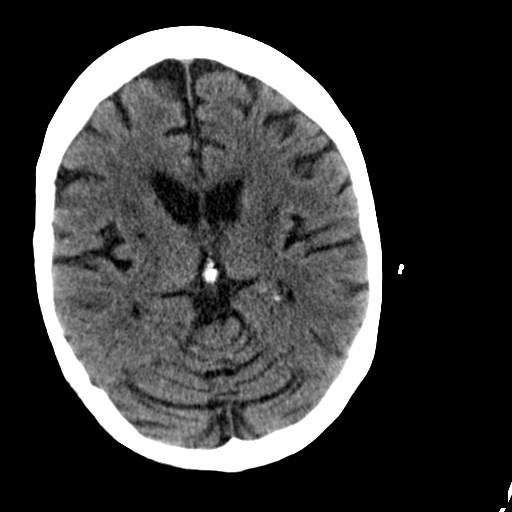
[im 17/30  brain]
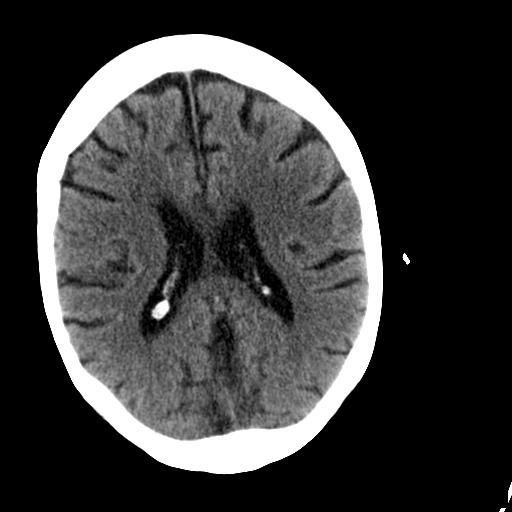
[im 19/30  brain]
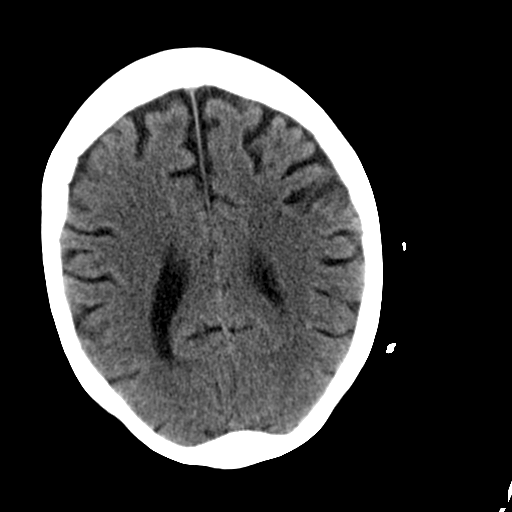
[im 19/30  bone]
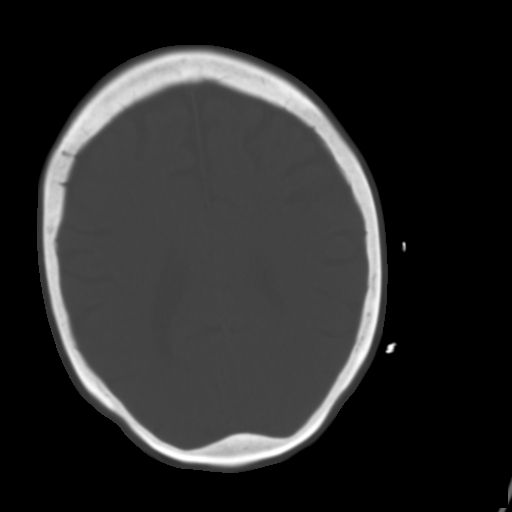
[im 21/30  brain]
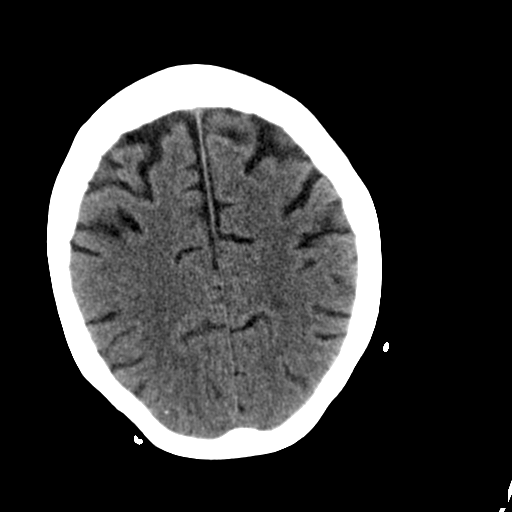
[im 23/30  brain]
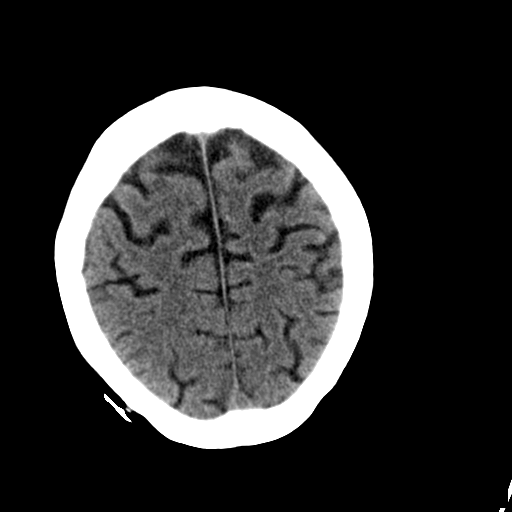
[im 25/30  brain]
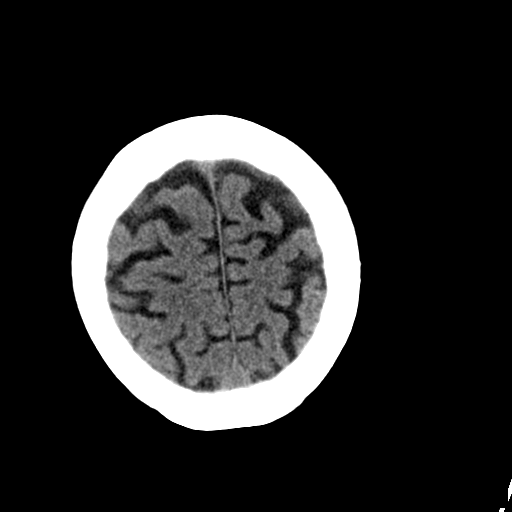
[im 27/30  brain]
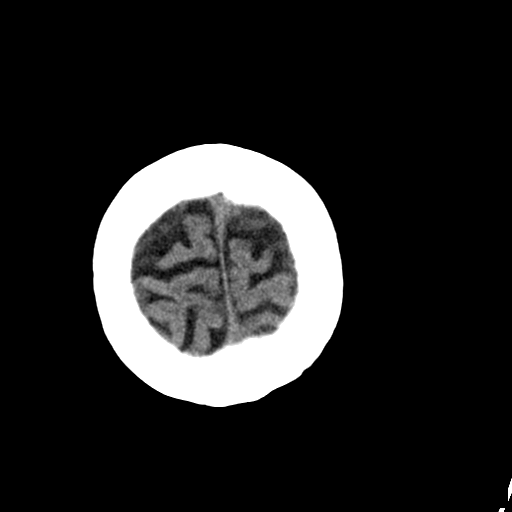
[im 27/30  bone]
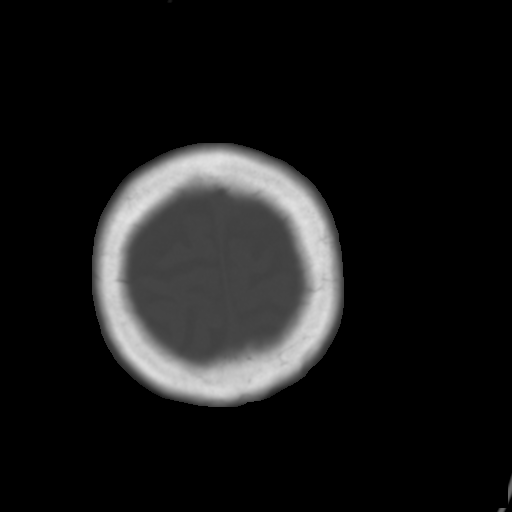

[13 of 30 positions shown; findings below may reference images not displayed]

FINDINGS: Precontrast images demonstrate mild cerebral and cerebellar
atrophy. Low attenuation is seen within the periventricular and subcortical
white matter regions. Basal ganglia lacunar infarcts are present bilaterally
including caudate head lacunar infarcts. There is no evidence of
intracranial hemorrhage, mass effect or midline shift.

Following intravenous administration of contrast, there is no enhancing mass
to suggest focal malignant or metastatic malignant disease. There are
changes of an air-fluid level in the left maxillary sinus and in the left
sphenoid sinus. Mucous retention cyst appears present in the right maxillary
sinus. The sinuses are otherwise clear. The mastoid air cells appear
unremarkable. The calvarium shows no lytic or sclerotic lesions or fracture.
IMPRESSION: 1.  No definite intracranial metastatic or malignant disease. Changes of
atrophy with chronic small vessel ischemic disease and basal ganglia lacunar
infarcts are present.
2.  Sinus disease as described. Correlate for sinusitis.

[REDACTED]

## 2014-09-22 NOTE — Consult Note (Signed)
Pt with only 3 stools today, no bleeding, no abd pain, stools are brown in color.  No plans at this time except wait on stool studies.  Likely diarrhea due to chemo and is playing out.   I told patient she should be able to go on her cruise this coming Friday.  Electronic Signatures: Scot JunElliott, Harsh Trulock T (MD)  (Signed on 10-Jun-13 18:01)  Authored  Last Updated: 10-Jun-13 18:01 by Scot JunElliott, Sheccid Lahmann T (MD)

## 2014-09-22 NOTE — Consult Note (Signed)
PATIENT NAME:  Cheyenne Howard, Cheyenne Howard MR#:  [id number] DATE OF BIRTH:  1941-03-08  DATE OF CONSULTATION:  11/07/2011  REFERRING PHYSICIAN:   CONSULTING PHYSICIAN:  Scot Jun, MD  HISTORY OF PRESENT ILLNESS: Patient is a 74 year old white female who has metastatic colon cancer who has been on chemotherapy and has developed diarrhea to the chemotherapy. I was asked to see her in consultation for this problem.   Patient has been on treatments with irinotecan 125 mg m/sq m and cetuximab 500 mg m/sq, last doses were 10 days ago. She was on Avastatin but this has been discontinued secondary to rectal bleeding. She developed neutropenic fever and was admitted to the hospital and was placed on IV vancomycin and Zosyn and this was changed to Levaquin a few days ago.   Yesterday she had diarrhea, perhaps every 30 minutes. Today it's considerably less since she was started on simvastatin and Lomotil.   To her knowledge she has never had Clostridium difficile infection.   REVIEW OF SYSTEMS: At this moment she has not had any abdominal pain, some abdominal distention. No nausea. No fever. No chills. She has been feeling considerably better than yesterday. Denies any headaches. No change in vision. No thyroid trouble. She has a rash from antecubital fossa of the left arm. No history of cardiac disease. She has a little bit of chronic obstructive pulmonary disease she uses p.r.n. inhalers for. No dysuria. No dysphagia. No nose bleeding No hypertension. No decubitus. Patient says she had a bad spell of diarrhea a year ago and was treated in the hospital but this is the worst episode she has had since then. Patient drinks city water. She does live with someone else and they have not had diarrhea.  HABITS: Never been a drinker. Smoked 3/4 pack a day for 20 years.   FAMILY HISTORY: Positive for colon cancer in her father.     ALLERGIES: No known drug allergies   PHYSICAL EXAMINATION:  GENERAL: Elderly white  female no acute distress.   VITAL SIGNS: Temperature 97.7, pulse 72, blood pressure 117/85, pulse oximetry 100% on room air.   HEENT: Sclerae nonicteric. Conjunctivae negative. Tongue negative. The head is atraumatic.   CHEST: Clear.   HEART: No murmurs or gallops I can hear.   ABDOMEN: Distended, fairly tight. Tympanitic to percussion. No palpable masses.   EXTREMITIES: No edema.   SKIN: Warm and dry.   PSYCH: Mood and affect are appropriate. Patient is a fairly good historian.   LABORATORY, DIAGNOSTIC, AND RADIOLOGICAL DATA: Glucose 96, BUN 6, creatinine 0.64, sodium 142, potassium 3.3, chloride 115, CO2 17, calcium 7.5, magnesium 1.4, total protein 5.1, albumin 2.2, total bilirubin 1.2, alkaline phosphatase 109, SGOT 23, SGPT 16, white blood count 1.9, hemoglobin 8.7, platelet count 117, neutrophil percentage is 30% today for total neutrophil count of 600. She has O+ blood with a negative antibody screen. She did have a urinalysis on 06/04 growing Klebsiella.   ASSESSMENT: Significant diarrhea started about a week after chemotherapy. Has been covered with antibiotics for neutropenic fever. She appears to be distended. The Lomotil and Sandostatin have apparently slowed the diarrhea was down. I am concerned about her development of potentially toxic megacolon. I talked to Dr. Sherrlyn Hock and will change the Lomotil to q.6 hours p.r.n. and give Sandostatin every 12 hours. I want to get a stool for C. difficile make sure she does not have Clostridium difficile since she has been on antibiotics and she immunosuppressed. Will follow with you. If  she has significant bleeding will take a look in the colon.  ____________________________ Scot Junobert T. Dory Verdun, MD rte:cms D: 11/07/2011 16:50:24 ET T: 11/08/2011 05:27:31 ET JOB#: [id number] cc: Scot Junobert T. Eddye Broxterman, MD, <Dictator> Tollie Pizzaimothy J. Orlie DakinFinnegan, MD Scot JunOBERT T Skylar Priest MD ELECTRONICALLY SIGNED 11/23/2011 16:06
